# Patient Record
Sex: Female | Born: 2005 | Race: Black or African American | Hispanic: No | Marital: Single | Smoking: Never smoker
Health system: Southern US, Community
[De-identification: ages and names within clinical notes are randomized; demographics above are authoritative.]

## PROBLEM LIST (undated history)

## (undated) DIAGNOSIS — F419 Anxiety disorder, unspecified: Secondary | ICD-10-CM

## (undated) DIAGNOSIS — F32A Depression, unspecified: Secondary | ICD-10-CM

---
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
## 2024-05-16 ENCOUNTER — Encounter: Payer: Self-pay | Admitting: *Deleted
